# Patient Record
Sex: Male | Born: 1985
Health system: Southern US, Community
[De-identification: ages and names within clinical notes are randomized; demographics above are authoritative.]

---

## 2006-01-29 ENCOUNTER — Emergency Department: Payer: Self-pay | Admitting: Emergency Medicine

## 2006-09-20 ENCOUNTER — Emergency Department: Payer: Self-pay | Admitting: Emergency Medicine

## 2006-10-31 ENCOUNTER — Emergency Department: Payer: Self-pay | Admitting: Emergency Medicine

## 2007-01-24 ENCOUNTER — Emergency Department: Payer: Self-pay | Admitting: Internal Medicine

## 2008-02-24 ENCOUNTER — Emergency Department: Payer: Self-pay | Admitting: Emergency Medicine

## 2008-05-01 ENCOUNTER — Emergency Department: Payer: Self-pay | Admitting: Emergency Medicine

## 2008-08-30 IMAGING — CR RIGHT THUMB 2+V
1 series · 3 of 3 positions shown · non-contrast
Comparison: none

REASON FOR EXAM: Injury
COMMENTS:

PROCEDURE:     DXR - DXR THUMB RIGHT HAND (1ST DIGIT)  - January 29, 2006  [DATE]
RESULT:        Three views of the RIGHT thumb show no fracture, dislocation
or other acute bony abnormality.

[Series 1: view not recorded · 0.17mm/px · 3 of 3 slices shown]
[im 1/3]
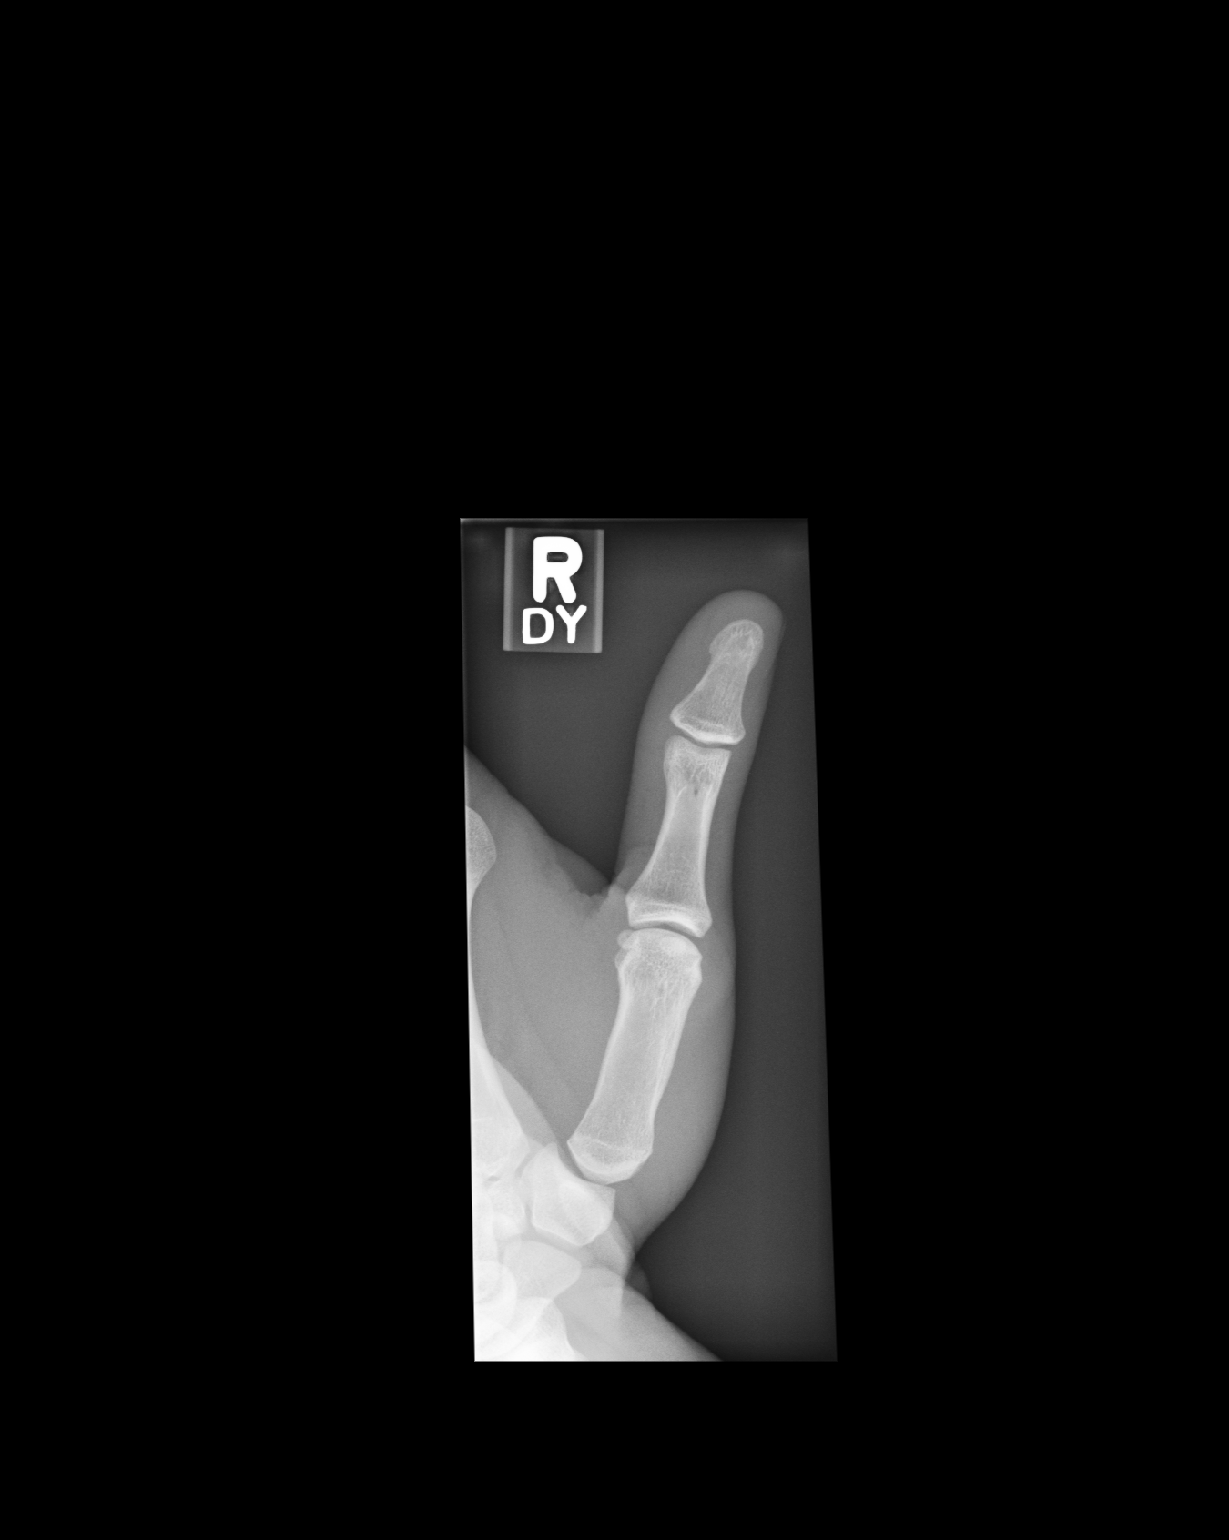
[im 2/3]
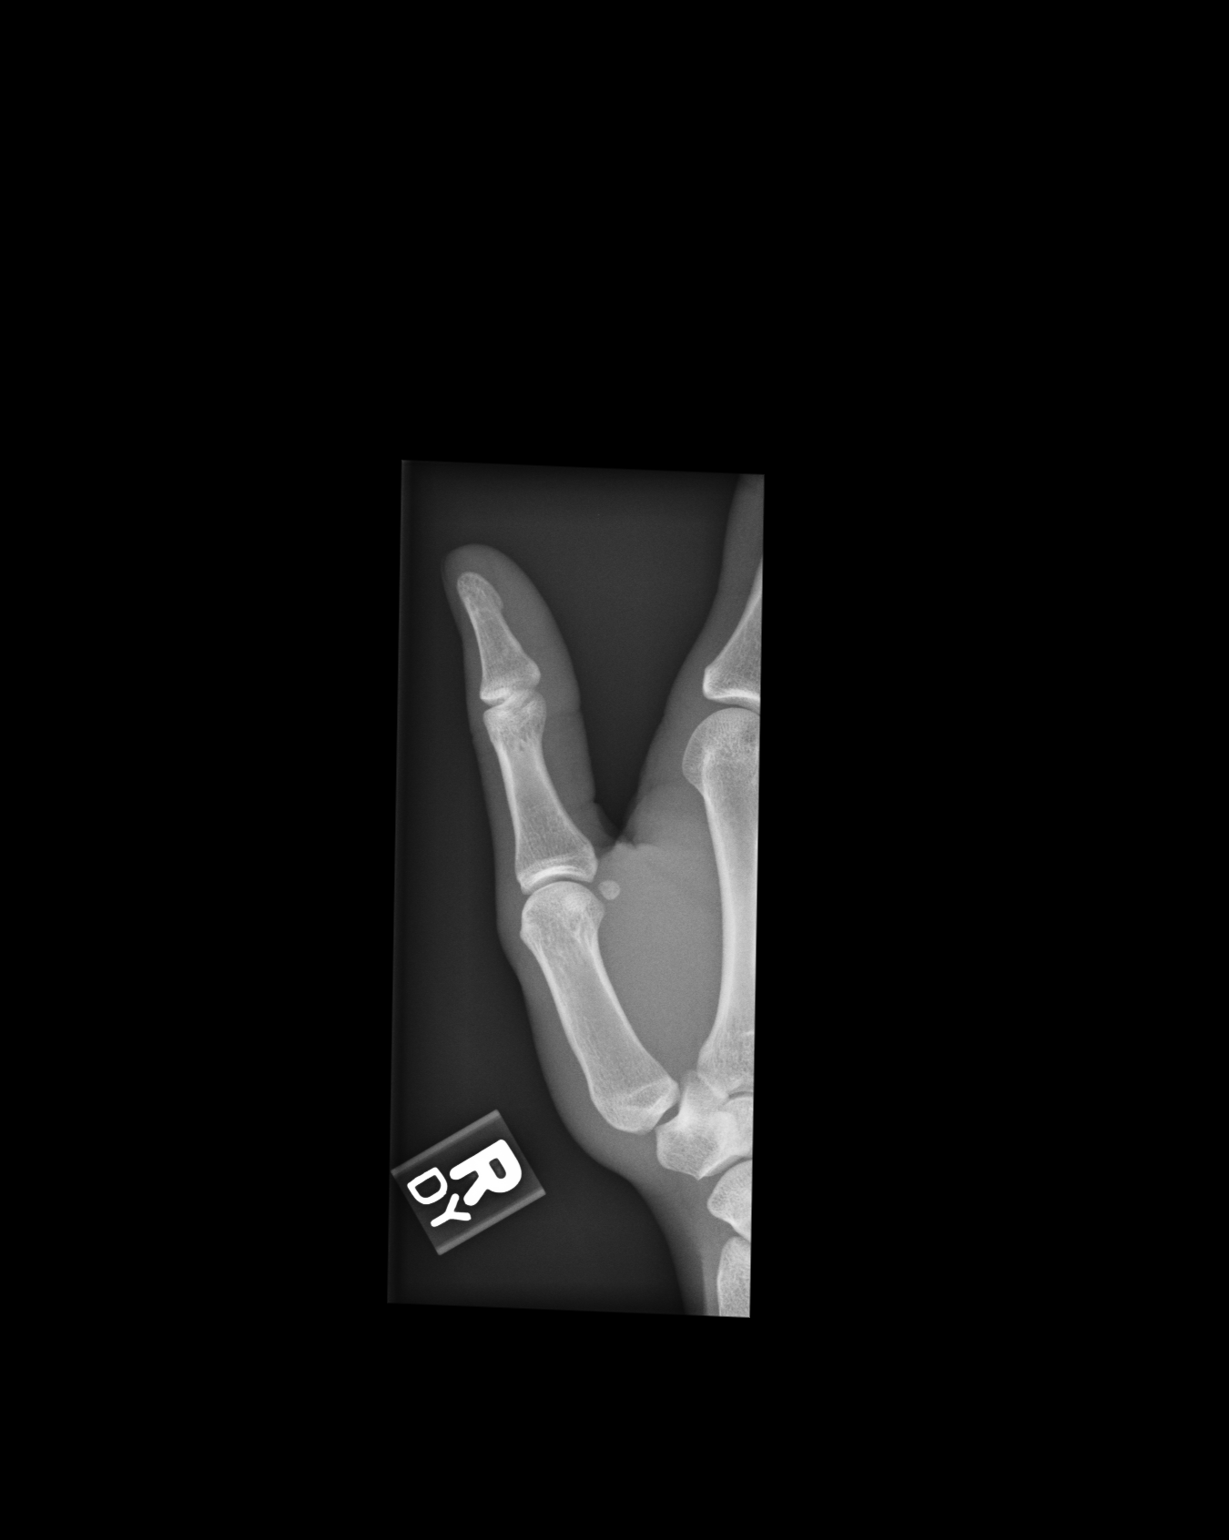
[im 3/3]
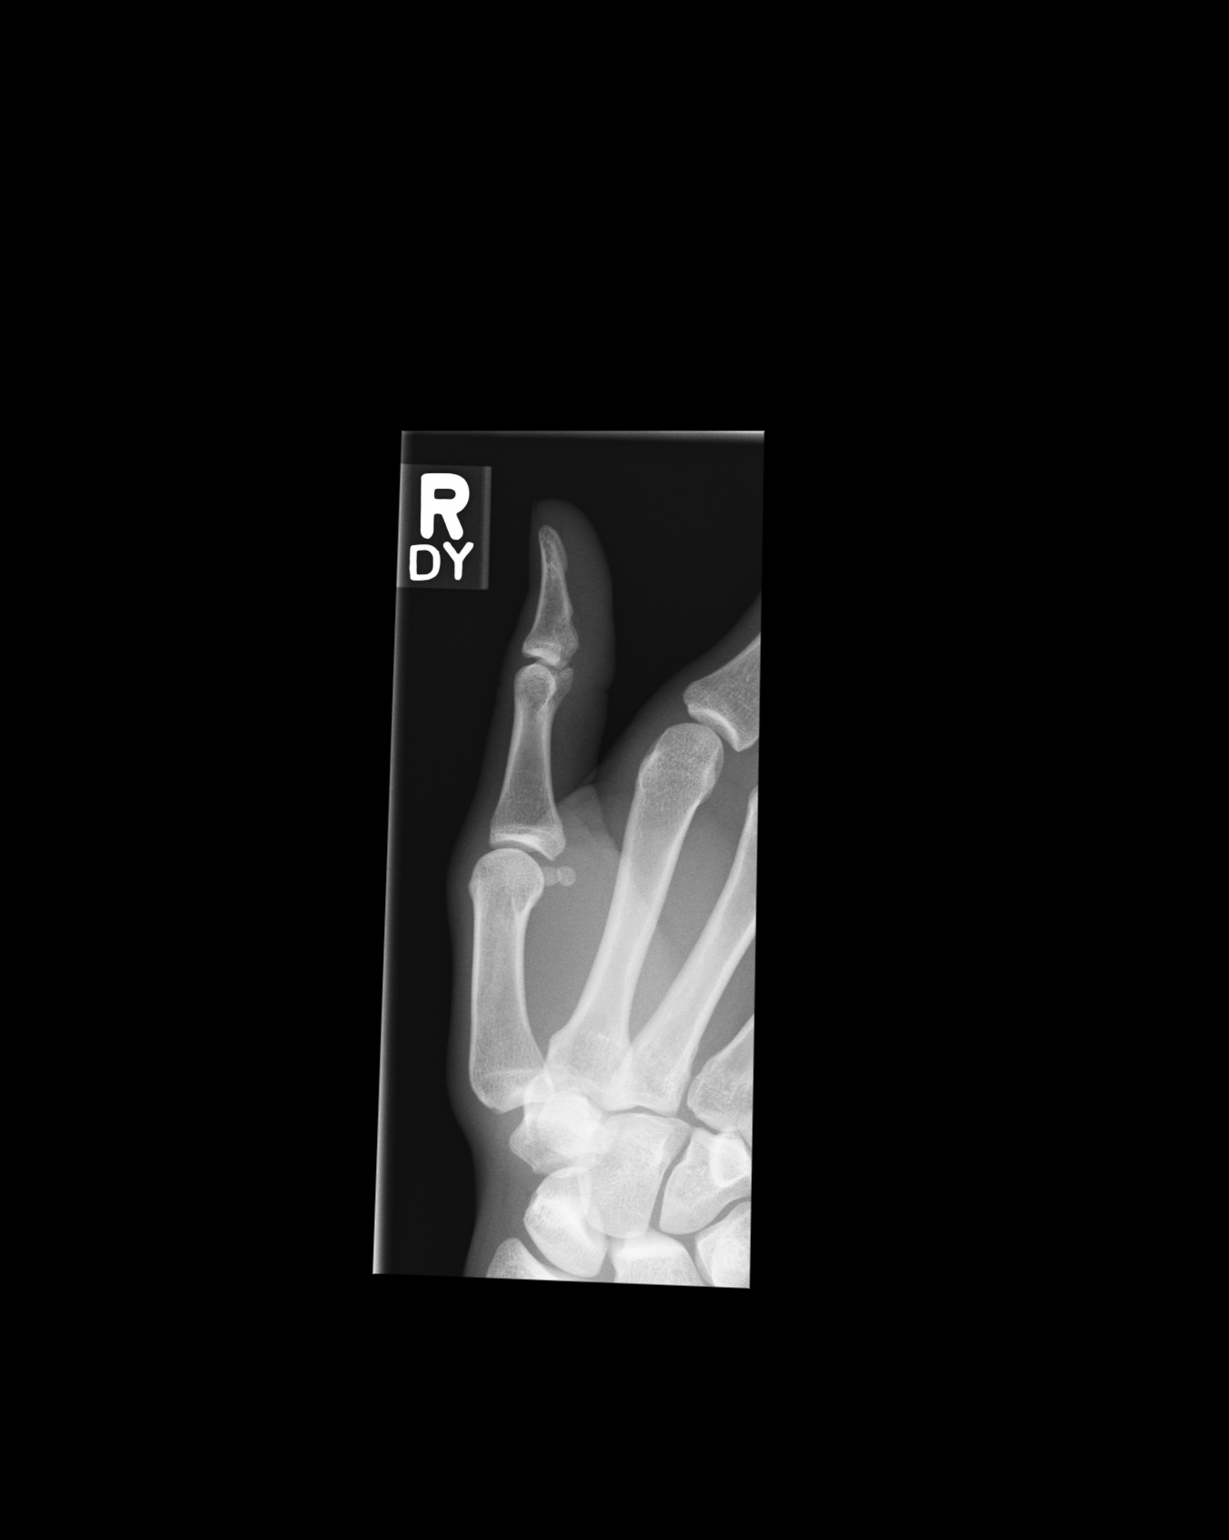

[3 of 3 positions shown; findings below may reference images not displayed]

IMPRESSION: 1.     No acute changes are identified.
2.     No radiodense soft tissue foreign body is seen.

## 2009-02-16 ENCOUNTER — Emergency Department: Payer: Self-pay | Admitting: Emergency Medicine
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
## 2016-07-16 ENCOUNTER — Emergency Department
Admission: EM | Admit: 2016-07-16 | Discharge: 2016-07-16 | Disposition: A | Discharge status: Home / Self Care | Payer: Self-pay | Attending: Emergency Medicine | Admitting: Emergency Medicine

## 2016-07-16 ENCOUNTER — Encounter: Payer: Self-pay | Admitting: Emergency Medicine

## 2016-07-16 DIAGNOSIS — W268XXA Contact with other sharp object(s), not elsewhere classified, initial encounter: Secondary | ICD-10-CM | POA: Insufficient documentation

## 2016-07-16 DIAGNOSIS — Y999 Unspecified external cause status: Secondary | ICD-10-CM | POA: Insufficient documentation

## 2016-07-16 DIAGNOSIS — Y929 Unspecified place or not applicable: Secondary | ICD-10-CM | POA: Insufficient documentation

## 2016-07-16 DIAGNOSIS — Y939 Activity, unspecified: Secondary | ICD-10-CM | POA: Insufficient documentation

## 2016-07-16 DIAGNOSIS — S91311A Laceration without foreign body, right foot, initial encounter: Secondary | ICD-10-CM | POA: Insufficient documentation

## 2016-07-16 DIAGNOSIS — Z23 Encounter for immunization: Secondary | ICD-10-CM | POA: Insufficient documentation

## 2016-07-16 MED ORDER — TETANUS-DIPHTHERIA TOXOIDS TD 5-2 LFU IM INJ
0.5000 mL | INJECTION | Freq: Once | INTRAMUSCULAR | Status: AC
  Administered 2016-07-16: 0.5 mL via INTRAMUSCULAR
  Filled 2016-07-16: qty 0.5

## 2016-07-16 MED ORDER — TETANUS-DIPHTH-ACELL PERTUSSIS 5-2.5-18.5 LF-MCG/0.5 IM SUSP
0.5000 mL | Freq: Once | INTRAMUSCULAR | Status: DC
  Filled 2016-07-16: qty 0.5

## 2016-07-16 MED ORDER — BACITRACIN ZINC 500 UNIT/GM EX OINT
TOPICAL_OINTMENT | Freq: Two times a day (BID) | CUTANEOUS | Status: DC
  Administered 2016-07-16: 1 via TOPICAL
  Filled 2016-07-16: qty 0.9

## 2016-07-16 MED ORDER — TETANUS-DIPHTH-ACELL PERTUSSIS 5-2.5-18.5 LF-MCG/0.5 IM SUSP
INTRAMUSCULAR | Status: AC
  Filled 2016-07-16: qty 0.5

## 2016-07-16 MED ORDER — LIDOCAINE HCL (PF) 1 % IJ SOLN
INTRAMUSCULAR | Status: AC
  Administered 2016-07-16: 15:00:00
  Filled 2016-07-16: qty 5

## 2016-07-16 MED ORDER — TETANUS-DIPHTHERIA TOXOIDS TD 5-2 LFU IM INJ: 0.5000 mL | INJECTION | Freq: Once | INTRAMUSCULAR | Status: DC

## 2016-07-16 MED ORDER — OXYCODONE-ACETAMINOPHEN 5-325 MG PO TABS
1.0000 | ORAL_TABLET | Freq: Once | ORAL | Status: AC
Start: 2016-07-16 — End: 2016-07-16
  Administered 2016-07-16: 1 via ORAL
  Filled 2016-07-16: qty 1

## 2016-07-16 MED ORDER — TRAMADOL HCL 50 MG PO TABS: 50.0000 mg | ORAL_TABLET | Freq: Four times a day (QID) | ORAL | 0 refills | Status: DC | PRN

## 2016-07-16 NOTE — ED Provider Notes (Signed)
[facility] Emergency Department Provider Note    ____________________________________________   First MD Initiated Contact with Patient 07/16/16 1446     (approximate)  I have reviewed the triage vital signs and the nursing notes.   HISTORY  Chief Complaint Laceration    HPI Larry Mills is a 31 y.o. male patient were several lacerations dose aspect of the right foot. Patient cut on a metal frame of the bed. State bleeding is hard to control. He denies loss sensation or loss of function of the foot.She tetanus shot is not up-to-date. Patient rates his pain as a 5/10. Patient described a pain as "achy". No other palliative measures prior to arrival.   History reviewed. No pertinent past medical history.  There are no active problems to display for this patient.   History reviewed. No pertinent surgical history.  Prior to Admission medications   Medication Sig Start Date End Date Taking? Authorizing Provider  traMADol (ULTRAM) 50 MG tablet Take 1 tablet (50 mg total) by mouth every 6 (six) hours as needed for moderate pain. 07/16/16   Larry Reining, PA-C    Allergies Shellfish allergy  History reviewed. No pertinent family history.  Social History Social History  Substance Use Topics  . Smoking status: Never Smoker  . Smokeless tobacco: Never Used  . Alcohol use No    Review of Systems Constitutional: No fever/chills Eyes: No visual changes. ENT: No sore throat. Cardiovascular: Denies chest pain. Respiratory: Denies shortness of breath. Gastrointestinal: No abdominal pain.  No nausea, no vomiting.  No diarrhea.  No constipation. Genitourinary: Negative for dysuria. Musculoskeletal: Negative for back pain. Skin: Negative for rash.Right foot laceration Neurological: Negative for headaches, focal weakness or numbness.    ____________________________________________   PHYSICAL EXAM:  VITAL SIGNS: ED Triage Vitals  Enc Vitals  Group     BP 07/16/16 1435 122/79     Pulse Rate 07/16/16 1435 68     Resp 07/16/16 1435 20     Temp 07/16/16 1435 98.1 F (36.7 C)     Temp Source 07/16/16 1435 Oral     SpO2 07/16/16 1435 100 %     Weight 07/16/16 1436 156 lb (70.8 kg)     Height 07/16/16 1436 5\' 10"  (1.778 m)     Head Circumference --      Peak Flow --      Pain Score 07/16/16 1436 5     Pain Loc --      Pain Edu? --      Excl. in GC? --     Constitutional: Alert and oriented. Well appearing and in no acute distress. Eyes: Conjunctivae are normal. PERRL. EOMI. Head: Atraumatic. Nose: No congestion/rhinnorhea. Mouth/Throat: Mucous membranes are moist.  Oropharynx non-erythematous. Neck: No stridor.  No cervical spine tenderness to palpation. Hematological/Lymphatic/Immunilogical: No cervical lymphadenopathy. Cardiovascular: Normal rate, regular rhythm. Grossly normal heart sounds.  Good peripheral circulation. Respiratory: Normal respiratory effort.  No retractions. Lungs CTAB. Gastrointestinal: Soft and nontender. No distention. No abdominal bruits. No CVA tenderness. Musculoskeletal: No lower extremity tenderness nor edema.  No joint effusions. Neurologic:  Normal speech and language. No gross focal neurologic deficits are appreciated. No gait instability. Skin:  Skin is warm, dry and intact. No rash noted.1 cm linear laceration dose aspect the right foot. Psychiatric: Mood and affect are normal. Speech and behavior are normal.  ____________________________________________   LABS (all labs ordered are listed, but only abnormal results are displayed)  Labs Reviewed -  No data to display ____________________________________________  EKG   ____________________________________________  RADIOLOGY   ____________________________________________   PROCEDURES  Procedure(s) performed: None  Procedures  Critical Care performed: No  ____________________________________________   INITIAL  IMPRESSION / ASSESSMENT AND PLAN / ED COURSE  Pertinent labs & imaging results that were available during my care of the patient were reviewed by me and considered in my medical decision making (see chart for details).  Right foot laceration. Patient given discharge care instructions. Patient advised to have suture removal in 10 days. Patient given prescription for tramadol for 3 days.      ____________________________________________   FINAL CLINICAL IMPRESSION(S) / ED DIAGNOSES  Final diagnoses:  Laceration of right foot, initial encounter      NEW MEDICATIONS STARTED DURING THIS VISIT:  New Prescriptions   TRAMADOL (ULTRAM) 50 MG TABLET    Take 1 tablet (50 mg total) by mouth every 6 (six) hours as needed for moderate pain.     Note:  This document was prepared using Dragon voice recognition software and may include unintentional dictation errors.    Larry ReiningRonald K Nobuko Gsell, PA-C 07/16/16 1510    Governor Rooksebecca Lord, MD 07/16/16 [phone]

## 2016-07-16 NOTE — ED Triage Notes (Signed)
Pt to ed with c/o right foot pain and laceration,  Pt states he scraped his foot on the bottom of the bed,  Bandage applied.

## 2017-05-09 DIAGNOSIS — H5213 Myopia, bilateral: Secondary | ICD-10-CM | POA: Diagnosis not present

## 2018-07-09 ENCOUNTER — Telehealth: Payer: Self-pay

## 2018-07-09 NOTE — Telephone Encounter (Signed)
Copied from CRM [id number]. Topic: Appointment Scheduling - New Patient >> Jul 06, 2018  5:54 PM Aretta Nip wrote: New patient has been scheduled for your office. Teofilo Pod Provider:Lauren Guse Date of Appointment:07/16/2018 @ 9:00 am Monday  Route to department's PEC pool.

## 2018-07-16 ENCOUNTER — Ambulatory Visit (INDEPENDENT_AMBULATORY_CARE_PROVIDER_SITE_OTHER): Payer: 59 | Admitting: Family Medicine

## 2018-07-16 ENCOUNTER — Encounter: Payer: Self-pay | Admitting: Family Medicine

## 2018-07-16 VITALS — BP 112/66 | HR 74 | Temp 98.3°F | Resp 18 | Ht 70.0 in | Wt 148.6 lb

## 2018-07-16 DIAGNOSIS — Z0000 Encounter for general adult medical examination without abnormal findings: Secondary | ICD-10-CM | POA: Diagnosis not present

## 2018-07-16 DIAGNOSIS — Z114 Encounter for screening for human immunodeficiency virus [HIV]: Secondary | ICD-10-CM | POA: Diagnosis not present

## 2018-07-16 LAB — COMPREHENSIVE METABOLIC PANEL
ALBUMIN: 4.5 g/dL (ref 3.5–5.2)
ALT: 18 U/L (ref 0–53)
AST: 19 U/L (ref 0–37)
Alkaline Phosphatase: 67 U/L (ref 39–117)
BUN: 14 mg/dL (ref 6–23)
CHLORIDE: 103 meq/L (ref 96–112)
CO2: 30 mEq/L (ref 19–32)
Calcium: 9.3 mg/dL (ref 8.4–10.5)
Creatinine, Ser: 1.12 mg/dL (ref 0.40–1.50)
GFR: 92.07 mL/min (ref >60.00)
Glucose, Bld: 103 mg/dL — ABNORMAL HIGH (ref 70–99)
Potassium: 3.9 mEq/L (ref 3.5–5.1)
SODIUM: 140 meq/L (ref 135–145)
TOTAL PROTEIN: 7.6 g/dL (ref 6.0–8.3)
Total Bilirubin: 0.6 mg/dL (ref 0.2–1.2)

## 2018-07-16 LAB — LIPID PANEL
CHOL/HDL RATIO: 2
CHOLESTEROL: 146 mg/dL (ref 0–200)
HDL: 69.9 mg/dL (ref >39.00)
LDL CALC: 60 mg/dL (ref 0–99)
NonHDL: 75.85
Triglycerides: 79 mg/dL (ref 0.0–149.0)
VLDL: 15.8 mg/dL (ref 0.0–40.0)

## 2018-07-16 LAB — VITAMIN D 25 HYDROXY (VIT D DEFICIENCY, FRACTURES): VITD: 22.26 ng/mL — AB (ref 30.00–100.00)

## 2018-07-16 LAB — CBC
HCT: 45.7 % (ref 39.0–52.0)
Hemoglobin: 15.3 g/dL (ref 13.0–17.0)
MCHC: 33.5 g/dL (ref 30.0–36.0)
MCV: 87.6 fl (ref 78.0–100.0)
Platelets: 201 10*3/uL (ref 150.0–400.0)
RBC: 5.22 Mil/uL (ref 4.22–5.81)
RDW: 13.1 % (ref 11.5–15.5)
WBC: 6.5 10*3/uL (ref 4.0–10.5)

## 2018-07-16 LAB — B12 AND FOLATE PANEL
FOLATE: 21.1 ng/mL (ref >5.9)
VITAMIN B 12: 580 pg/mL (ref 211–911)

## 2018-07-16 NOTE — Progress Notes (Signed)
Subjective:    Patient ID: Larry Mills, male    DOB: 1985-10-04, 33 y.o.   MRN: [id number]  HPI   Patient presents to clinic to establish with PCP.  Currently has no medical issues, is taking no medications.  He does see dentist twice per year, sees eye doctor at least every 1 to 2 years.  Does wear glasses.  He works as a Teacher, adult education.  He has a 22 year old son.  Past medical, social, surgical, family history reviewed and updated in chart  History reviewed. No pertinent past medical history.  Social History   Tobacco Use  . Smoking status: Never Smoker  . Smokeless tobacco: Never Used  Substance Use Topics  . Alcohol use: No   History reviewed. No pertinent surgical history.  Family History  Problem Relation Age of Onset  . Alcohol abuse Father   . Drug abuse Father     Review of Systems  Constitutional: Negative for chills, fatigue and fever.  HENT: Negative for congestion, ear pain, sinus pain and sore throat.   Eyes: Negative.   Respiratory: Negative for cough, shortness of breath and wheezing.   Cardiovascular: Negative for chest pain, palpitations and leg swelling.  Gastrointestinal: Negative for abdominal pain, diarrhea, nausea and vomiting.  Genitourinary: Negative for dysuria, frequency and urgency.  Musculoskeletal: Negative for arthralgias and myalgias.  Skin: Negative for color change, pallor and rash.  Neurological: Negative for syncope, light-headedness and headaches.  Psychiatric/Behavioral: The patient is not nervous/anxious.       Objective:   Physical Exam Vitals signs and nursing note reviewed.  Constitutional:      General: He is not in acute distress.    Appearance: He is normal weight.  HENT:     Head: Normocephalic and atraumatic.     Right Ear: Tympanic membrane, ear canal and external ear normal. There is no impacted cerumen.     Left Ear: Tympanic membrane, ear canal and external ear normal. There is no impacted cerumen.     Mouth/Throat:     Mouth: Mucous membranes are moist.     Pharynx: Oropharynx is clear.  Eyes:     General: No scleral icterus.    Extraocular Movements: Extraocular movements intact.     Conjunctiva/sclera: Conjunctivae normal.     Pupils: Pupils are equal, round, and reactive to light.  Neck:     Musculoskeletal: Normal range of motion and neck supple. No neck rigidity.     Thyroid: No thyromegaly or thyroid tenderness.     Vascular: No carotid bruit.     Trachea: Trachea and phonation normal.  Cardiovascular:     Rate and Rhythm: Normal rate and regular rhythm.     Pulses: Normal pulses.     Heart sounds: Normal heart sounds.  Pulmonary:     Effort: Pulmonary effort is normal.     Breath sounds: Normal breath sounds.  Abdominal:     General: Abdomen is flat. Bowel sounds are normal. There is no distension.     Palpations: There is no mass.     Tenderness: There is no abdominal tenderness. There is no guarding or rebound.     Hernia: No hernia is present. There is no hernia in the right inguinal area or left inguinal area.  Genitourinary:    Penis: Normal and circumcised. No erythema, tenderness, discharge, swelling or lesions.      Scrotum/Testes: Normal.     Epididymis:     Right: Normal.  Left: Normal.  Lymphadenopathy:     Cervical: No cervical adenopathy.     Lower Body: No right inguinal adenopathy. No left inguinal adenopathy.  Skin:    General: Skin is warm and dry.     Capillary Refill: Capillary refill takes less than 2 seconds.     Coloration: Skin is not jaundiced or pale.  Neurological:     Mental Status: He is alert and oriented to person, place, and time.     Cranial Nerves: No cranial nerve deficit.     Sensory: No sensory deficit.     Motor: No weakness.     Coordination: Coordination normal.     Gait: Gait normal.  Psychiatric:        Mood and Affect: Mood normal.        Behavior: Behavior normal.        Thought Content: Thought content normal.      Vitals:   07/16/18 0850  BP: 112/66  Pulse: 74  Resp: 18  Temp: 98.3 F (36.8 C)  SpO2: 94%   Depression screen PHQ 2/9 07/16/2018  Decreased Interest 0  Down, Depressed, Hopeless 0  PHQ - 2 Score 0  Altered sleeping 0  Tired, decreased energy 0  Change in appetite 0  Feeling bad or failure about yourself  0  Trouble concentrating 0  Suicidal thoughts 0  PHQ-9 Score 0  Difficult doing work/chores Not difficult at all      Assessment & Plan:   Well adult exam - we will get blood work in clinic including CBC, CMP, lipid panel, thyroid panel, B12, vitamin D and HIV screening.  Patient is a healthy 33 year old male.  Discussed healthy diet and exercise including a diet full of a variety of vegetables, fruits, lean proteins, and less sugars and carbohydrates.  Encouraged good water intake and regular physical activity.  Patient always wears seatbelt when in vehicle.  Also discussed safe sun practices including wearing long sleeves, using SPF of at least 30 when outdoors for extended period of time and also wearing a hat to protect skin of face and head.  Patient will follow-up annually for complete physical exam.  He will return to clinic sooner if any issues arise.

## 2018-07-17 LAB — HIV ANTIBODY (ROUTINE TESTING W REFLEX): HIV: NONREACTIVE

## 2018-07-17 LAB — THYROID PANEL WITH TSH
FREE THYROXINE INDEX: 2.9 (ref 1.4–3.8)
T3 Uptake: 32 % (ref 22–35)
T4, Total: 9.1 ug/dL (ref 4.9–10.5)
TSH: 2.27 m[IU]/L (ref 0.40–4.50)

## 2019-11-26 DIAGNOSIS — H5213 Myopia, bilateral: Secondary | ICD-10-CM | POA: Diagnosis not present

## 2023-01-13 ENCOUNTER — Ambulatory Visit: Payer: Commercial Managed Care - PPO | Admitting: Nurse Practitioner

## 2023-01-13 ENCOUNTER — Encounter: Payer: Self-pay | Admitting: Nurse Practitioner

## 2023-01-13 VITALS — BP 122/80 | HR 66 | Temp 98.1°F | Ht 70.5 in | Wt 169.6 lb

## 2023-01-13 DIAGNOSIS — Z0000 Encounter for general adult medical examination without abnormal findings: Secondary | ICD-10-CM | POA: Insufficient documentation

## 2023-01-13 DIAGNOSIS — Z1329 Encounter for screening for other suspected endocrine disorder: Secondary | ICD-10-CM

## 2023-01-13 DIAGNOSIS — Z13220 Encounter for screening for lipoid disorders: Secondary | ICD-10-CM

## 2023-01-13 DIAGNOSIS — E559 Vitamin D deficiency, unspecified: Secondary | ICD-10-CM | POA: Insufficient documentation

## 2023-01-13 NOTE — Progress Notes (Signed)
Bethanie Dicker, NP-C Phone: [phone]  Larry Mills is a 37 y.o. male who presents today to establish care and for annual exam. He has no significant past medical history. He is not on any medications. He has no complaints or new concerns today.   Diet: Well balanced- increased protein, occasional sweets, no soda Exercise: Has a home gym with free weights and machines- works out 4-5 days per week Family history-  Prostate cancer: No  Colon cancer: No Sexually active: Yes Vaccines-   Flu: Not due  Tetanus: 07/16/2016  COVID19: Never HIV screening: Negative Hep C Screening: Deferred Tobacco use: No Alcohol use: Yes, special occasions Illicit Drug use: No Dentist: Yes Ophthalmology: No   Active Ambulatory Problems    Diagnosis Date Noted   Preventative health care 01/13/2023   Vitamin D deficiency 01/13/2023   Resolved Ambulatory Problems    Diagnosis Date Noted   No Resolved Ambulatory Problems   No Additional Past Medical History    Family History  Problem Relation Age of Onset   Alcohol abuse Father    Drug abuse Father     Social History   Socioeconomic History   Marital status: Married    Spouse name: Not on file   Number of children: Not on file   Years of education: Not on file   Highest education level: Not on file  Occupational History   Not on file  Tobacco Use   Smoking status: Never   Smokeless tobacco: Never  Vaping Use   Vaping status: Never Used  Substance and Sexual Activity   Alcohol use: No   Drug use: Yes    Types: Marijuana   Sexual activity: Yes  Other Topics Concern   Not on file  Social History Narrative   Not on file   Social Determinants of Health   Financial Resource Strain: Not on file  Food Insecurity: Not on file  Transportation Needs: Not on file  Physical Activity: Not on file  Stress: Not on file  Social Connections: Not on file  Intimate Partner Violence: Not on file    ROS  General:  Negative for  unexplained weight loss, fever Skin: Negative for new or changing mole, sore that won't heal HEENT: Negative for trouble hearing, trouble seeing, ringing in ears, mouth sores, hoarseness, change in voice, dysphagia. CV:  Negative for chest pain, dyspnea, edema, palpitations Resp: Negative for cough, dyspnea, hemoptysis GI: Negative for nausea, vomiting, diarrhea, constipation, abdominal pain, melena, hematochezia. GU: Negative for dysuria, incontinence, urinary hesitance, hematuria, vaginal or penile discharge, polyuria, sexual difficulty, lumps in testicle or breasts MSK: Negative for muscle cramps or aches, joint pain or swelling Neuro: Negative for headaches, weakness, numbness, dizziness, passing out/fainting Psych: Negative for depression, anxiety, memory problems  Objective  Physical Exam Vitals:   01/13/23 1516  BP: 122/80  Pulse: 66  Temp: 98.1 F (36.7 C)  SpO2: 99%    BP Readings from Last 3 Encounters:  01/13/23 122/80  07/16/18 112/66  07/16/16 122/79   Wt Readings from Last 3 Encounters:  01/13/23 169 lb 9.6 oz (76.9 kg)  07/16/18 148 lb 9.6 oz (67.4 kg)  07/16/16 156 lb (70.8 kg)    Physical Exam Constitutional:      General: He is not in acute distress.    Appearance: Normal appearance.  HENT:     Head: Normocephalic.     Right Ear: Tympanic membrane normal.     Left Ear: Tympanic membrane normal.  Nose: Nose normal.     Mouth/Throat:     Mouth: Mucous membranes are moist.     Pharynx: Oropharynx is clear.  Eyes:     Conjunctiva/sclera: Conjunctivae normal.     Pupils: Pupils are equal, round, and reactive to light.  Neck:     Thyroid: No thyromegaly.  Cardiovascular:     Rate and Rhythm: Normal rate and regular rhythm.     Heart sounds: Normal heart sounds.  Pulmonary:     Effort: Pulmonary effort is normal.     Breath sounds: Normal breath sounds.  Abdominal:     General: Abdomen is flat. Bowel sounds are normal.     Palpations: Abdomen  is soft. There is no mass.     Tenderness: There is no abdominal tenderness.  Musculoskeletal:        General: Normal range of motion.  Lymphadenopathy:     Cervical: No cervical adenopathy.  Skin:    General: Skin is warm and dry.     Findings: No rash.  Neurological:     General: No focal deficit present.     Mental Status: He is alert.  Psychiatric:        Mood and Affect: Mood normal.        Behavior: Behavior normal.    Assessment/Plan:   Preventative health care Assessment & Plan: Physical exam complete. Lab work as outlined. Will contact patient with results. Colonoscopy and PSA screening not indicated. Flu vaccine not due. Tetanus vaccine- UTD. Declined all COVID vaccines. HIV screening- negative. Hep C screening- deferred. Recommended follow up with Dentist and establishing with Ophthalmology for annual exams. Encouraged to continue healthy diet and exercise. Return to care in one year, sooner PRN.  Orders: -     CBC with Differential/Platelet -     Comprehensive metabolic panel  Vitamin D deficiency Assessment & Plan: Noted in 2020 lab work. No longer taking a supplement. Will check vitamin D level today.   Orders: -     VITAMIN D 25 Hydroxy (Vit-D Deficiency, Fractures)  Thyroid disorder screen -     TSH  Lipid screening -     Lipid panel   Return in about 1 year (around 01/13/2024) for Annual Exam, sooner PRN.   Bethanie Dicker, NP-C [facility] - [facility]

## 2023-01-13 NOTE — Assessment & Plan Note (Signed)
Noted in 2020 lab work. No longer taking a supplement. Will check vitamin D level today.

## 2023-01-13 NOTE — Assessment & Plan Note (Signed)
Physical exam complete. Lab work as outlined. Will contact patient with results. Colonoscopy and PSA screening not indicated. Flu vaccine not due. Tetanus vaccine- UTD. Declined all COVID vaccines. HIV screening- negative. Hep C screening- deferred. Recommended follow up with Dentist and establishing with Ophthalmology for annual exams. Encouraged to continue healthy diet and exercise. Return to care in one year, sooner PRN.

## 2023-01-14 LAB — COMPREHENSIVE METABOLIC PANEL
ALT: 21 IU/L (ref 0–44)
AST: 23 IU/L (ref 0–40)
Albumin: 4.5 g/dL (ref 4.1–5.1)
Alkaline Phosphatase: 80 IU/L (ref 44–121)
BUN/Creatinine Ratio: 17 (ref 9–20)
BUN: 18 mg/dL (ref 6–20)
Bilirubin Total: 0.5 mg/dL (ref 0.0–1.2)
CO2: 21 mmol/L (ref 20–29)
Calcium: 9.3 mg/dL (ref 8.7–10.2)
Chloride: 102 mmol/L (ref 96–106)
Creatinine, Ser: 1.09 mg/dL (ref 0.76–1.27)
Globulin, Total: 2.8 g/dL (ref 1.5–4.5)
Glucose: 77 mg/dL (ref 70–99)
Potassium: 4 mmol/L (ref 3.5–5.2)
Sodium: 142 mmol/L (ref 134–144)
Total Protein: 7.3 g/dL (ref 6.0–8.5)
eGFR: 90 mL/min/{1.73_m2} (ref >59)

## 2023-01-14 LAB — CBC WITH DIFFERENTIAL/PLATELET
Basophils Absolute: 0 10*3/uL (ref 0.0–0.2)
Basos: 1 %
EOS (ABSOLUTE): 0.6 10*3/uL — ABNORMAL HIGH (ref 0.0–0.4)
Eos: 8 %
Hematocrit: 41.2 % (ref 37.5–51.0)
Hemoglobin: 14 g/dL (ref 13.0–17.7)
Immature Grans (Abs): 0 10*3/uL (ref 0.0–0.1)
Immature Granulocytes: 0 %
Lymphocytes Absolute: 2.7 10*3/uL (ref 0.7–3.1)
Lymphs: 35 %
MCH: 29.4 pg (ref 26.6–33.0)
MCHC: 34 g/dL (ref 31.5–35.7)
MCV: 86 fL (ref 79–97)
Monocytes Absolute: 0.6 10*3/uL (ref 0.1–0.9)
Monocytes: 8 %
Neutrophils Absolute: 3.7 10*3/uL (ref 1.4–7.0)
Neutrophils: 48 %
Platelets: 194 10*3/uL (ref 150–450)
RBC: 4.77 x10E6/uL (ref 4.14–5.80)
RDW: 13.5 % (ref 11.6–15.4)
WBC: 7.7 10*3/uL (ref 3.4–10.8)

## 2023-01-14 LAB — VITAMIN D 25 HYDROXY (VIT D DEFICIENCY, FRACTURES): Vit D, 25-Hydroxy: 24.6 ng/mL — ABNORMAL LOW (ref 30.0–100.0)

## 2023-01-14 LAB — LIPID PANEL
Chol/HDL Ratio: 2 ratio (ref 0.0–5.0)
Cholesterol, Total: 157 mg/dL (ref 100–199)
HDL: 78 mg/dL (ref >39)
LDL Chol Calc (NIH): 66 mg/dL (ref 0–99)
Triglycerides: 69 mg/dL (ref 0–149)
VLDL Cholesterol Cal: 13 mg/dL (ref 5–40)

## 2023-01-14 LAB — TSH: TSH: 1.18 u[IU]/mL (ref 0.450–4.500)

## 2024-01-16 ENCOUNTER — Ambulatory Visit: Payer: Commercial Managed Care - PPO | Admitting: Nurse Practitioner

## 2024-01-16 VITALS — BP 120/76 | HR 66 | Temp 98.1°F | Ht 70.5 in | Wt 175.8 lb

## 2024-01-16 DIAGNOSIS — Z0000 Encounter for general adult medical examination without abnormal findings: Secondary | ICD-10-CM

## 2024-01-16 DIAGNOSIS — Z13220 Encounter for screening for lipoid disorders: Secondary | ICD-10-CM

## 2024-01-16 DIAGNOSIS — Z1329 Encounter for screening for other suspected endocrine disorder: Secondary | ICD-10-CM

## 2024-01-16 DIAGNOSIS — E559 Vitamin D deficiency, unspecified: Secondary | ICD-10-CM

## 2024-01-16 NOTE — Progress Notes (Signed)
 [name], NP-C Phone: [phone]  Larry Mills is a 38 y.o. male who presents today for annual exam.   Discussed the use of AI scribe software for clinical note transcription with the patient, who gave verbal consent to proceed.  History of Present Illness   Larry Mills is a 38 year old male who presents for an annual physical exam.  He has no new problems or concerns. No chest pain, shortness of breath, abdominal pain, constipation, diarrhea, burning during urination, headaches, dizziness, mood problems, anxiety, or depression. He experiences snoring that sometimes wakes him up, but he feels rested and does not have excessive daytime sleepiness. His wife has noted his snoring, and he occasionally wakes himself up due to it.  He exercises regularly, about four to five times a week, using a home gym for weight training and cardio. His diet is fairly well-balanced, though he eats out more often than cooking at home and consumes sweets occasionally. He does not drink sodas and consumes alcohol very rarely, about two to three times a year on special occasions. He does not smoke or use drugs.  He sees a dentist regularly but has not visited an eye doctor recently and acknowledges needing new glasses.  His past medical history includes a mildly decreased vitamin D  level noted in previous labs, for which he has not been taking any supplements. He has not had any recent blood work since last year when all other parameters were normal. He reports no changes in his family history, including no new cases of colon or prostate cancer.      Social History   Tobacco Use  Smoking Status Never  Smokeless Tobacco Never    No current outpatient medications on file prior to visit.   No current facility-administered medications on file prior to visit.     ROS see history of present illness  Objective  Physical Exam Vitals:   01/16/24 1442  BP: 120/76  Pulse: 66  Temp: 98.1 F (36.7  C)  SpO2: 96%    BP Readings from Last 3 Encounters:  01/16/24 120/76  01/13/23 122/80  07/16/18 112/66   Wt Readings from Last 3 Encounters:  01/16/24 175 lb 12.8 oz (79.7 kg)  01/13/23 169 lb 9.6 oz (76.9 kg)  07/16/18 148 lb 9.6 oz (67.4 kg)    Physical Exam Constitutional:      General: He is not in acute distress.    Appearance: Normal appearance.  HENT:     Head: Normocephalic.     Right Ear: Tympanic membrane normal.     Left Ear: Tympanic membrane normal.     Nose: Nose normal.     Mouth/Throat:     Mouth: Mucous membranes are moist.     Pharynx: Oropharynx is clear.  Eyes:     Conjunctiva/sclera: Conjunctivae normal.     Pupils: Pupils are equal, round, and reactive to light.  Neck:     Thyroid : No thyromegaly.  Cardiovascular:     Rate and Rhythm: Normal rate and regular rhythm.     Heart sounds: Normal heart sounds.  Pulmonary:     Effort: Pulmonary effort is normal.     Breath sounds: Normal breath sounds.  Abdominal:     General: Abdomen is flat. Bowel sounds are normal.     Palpations: Abdomen is soft. There is no mass.     Tenderness: There is no abdominal tenderness.  Musculoskeletal:        General: Normal range  of motion.  Lymphadenopathy:     Cervical: No cervical adenopathy.  Skin:    General: Skin is warm and dry.     Findings: No rash.  Neurological:     General: No focal deficit present.     Mental Status: He is alert.  Psychiatric:        Mood and Affect: Mood normal.        Behavior: Behavior normal.      Assessment/Plan: Please see individual problem list.  Preventative health care Assessment & Plan: Physical exam complete. Deferred lab work today. Colonoscopy and PSA screening not indicated. Flu vaccine not due. Tetanus vaccine is up to date. Declined all COVID vaccines. Continue routine dental exams, encourage regular eye exams. He maintains a healthy lifestyle with regular exercise and a balanced diet. No mood or sleep  issues. Continue regular exercise and maintain a well-balanced diet. Return to care in one year, sooner as needed.    Vitamin D  deficiency Assessment & Plan: Previously identified mild deficiency, not taking a supplement. Encourage daily over-the-counter vitamin D3 supplement.      Return in about 1 year (around 01/15/2025) for Annual Exam, sooner as needed.   [name], NP-C [facility] - [facility]

## 2024-02-01 ENCOUNTER — Encounter: Payer: Self-pay | Admitting: Nurse Practitioner

## 2024-02-01 NOTE — Assessment & Plan Note (Addendum)
 Physical exam complete. Deferred lab work today. Colonoscopy and PSA screening not indicated. Flu vaccine not due. Tetanus vaccine is up to date. Declined all COVID vaccines. Continue routine dental exams, encourage regular eye exams. He maintains a healthy lifestyle with regular exercise and a balanced diet. No mood or sleep issues. Continue regular exercise and maintain a well-balanced diet. Return to care in one year, sooner as needed.

## 2024-02-01 NOTE — Assessment & Plan Note (Signed)
 Previously identified mild deficiency, not taking a supplement. Encourage daily over-the-counter vitamin D3 supplement.

## 2024-03-18 DIAGNOSIS — H5213 Myopia, bilateral: Secondary | ICD-10-CM | POA: Diagnosis not present

## 2024-03-18 DIAGNOSIS — H47393 Other disorders of optic disc, bilateral: Secondary | ICD-10-CM | POA: Diagnosis not present

## 2025-01-16 ENCOUNTER — Encounter: Admitting: Nurse Practitioner

## 2025-01-21 ENCOUNTER — Encounter: Admitting: Nurse Practitioner
